# Patient Record
Sex: Male | Born: 1990 | Race: White | Hispanic: No | Marital: Single
Health system: Southern US, Community
[De-identification: ages and names within clinical notes are randomized; demographics above are authoritative.]

---

## 2019-10-14 ENCOUNTER — Emergency Department (HOSPITAL_COMMUNITY): Payer: Self-pay

## 2019-10-14 ENCOUNTER — Emergency Department (HOSPITAL_COMMUNITY)
Admission: EM | Admit: 2019-10-14 | Discharge: 2019-10-14 | Disposition: A | Discharge status: Home / Self Care | Payer: Self-pay | Attending: Emergency Medicine | Admitting: Emergency Medicine

## 2019-10-14 ENCOUNTER — Other Ambulatory Visit: Payer: Self-pay

## 2019-10-14 DIAGNOSIS — Y9389 Activity, other specified: Secondary | ICD-10-CM | POA: Insufficient documentation

## 2019-10-14 DIAGNOSIS — Y92410 Unspecified street and highway as the place of occurrence of the external cause: Secondary | ICD-10-CM | POA: Insufficient documentation

## 2019-10-14 DIAGNOSIS — T1490XA Injury, unspecified, initial encounter: Secondary | ICD-10-CM | POA: Insufficient documentation

## 2019-10-14 DIAGNOSIS — Y998 Other external cause status: Secondary | ICD-10-CM | POA: Insufficient documentation

## 2019-10-14 LAB — LACTIC ACID, PLASMA: Lactic Acid, Venous: 1.5 mmol/L (ref 0.5–1.9)

## 2019-10-14 LAB — SAMPLE TO BLOOD BANK

## 2019-10-14 LAB — PROTIME-INR
INR: 1 (ref 0.8–1.2)
Prothrombin Time: 12.8 seconds (ref 11.4–15.2)

## 2019-10-14 LAB — COMPREHENSIVE METABOLIC PANEL
ALT: 18 U/L (ref 0–44)
AST: 25 U/L (ref 15–41)
Albumin: 4 g/dL (ref 3.5–5.0)
Alkaline Phosphatase: 56 U/L (ref 38–126)
Anion gap: 12 (ref 5–15)
BUN: 15 mg/dL (ref 6–20)
CO2: 25 mmol/L (ref 22–32)
Calcium: 9 mg/dL (ref 8.9–10.3)
Chloride: 99 mmol/L (ref 98–111)
Creatinine, Ser: 0.99 mg/dL (ref 0.61–1.24)
GFR calc Af Amer: >60 mL/min (ref >60)
GFR calc non Af Amer: >60 mL/min (ref >60)
Glucose, Bld: 158 mg/dL — ABNORMAL HIGH (ref 70–99)
Potassium: 4 mmol/L (ref 3.5–5.1)
Sodium: 136 mmol/L (ref 135–145)
Total Bilirubin: 0.9 mg/dL (ref 0.3–1.2)
Total Protein: 7.5 g/dL (ref 6.5–8.1)

## 2019-10-14 LAB — CBC
HCT: 44.4 % (ref 39.0–52.0)
Hemoglobin: 14.3 g/dL (ref 13.0–17.0)
MCH: 28.5 pg (ref 26.0–34.0)
MCHC: 32.2 g/dL (ref 30.0–36.0)
MCV: 88.6 fL (ref 80.0–100.0)
Platelets: 220 10*3/uL (ref 150–400)
RBC: 5.01 MIL/uL (ref 4.22–5.81)
RDW: 12.9 % (ref 11.5–15.5)
WBC: 21.9 10*3/uL — ABNORMAL HIGH (ref 4.0–10.5)
nRBC: 0 % (ref 0.0–0.2)

## 2019-10-14 LAB — I-STAT CHEM 8, ED
BUN: 18 mg/dL (ref 6–20)
Calcium, Ion: 1.18 mmol/L (ref 1.15–1.40)
Chloride: 99 mmol/L (ref 98–111)
Creatinine, Ser: 0.8 mg/dL (ref 0.61–1.24)
Glucose, Bld: 156 mg/dL — ABNORMAL HIGH (ref 70–99)
HCT: 45 % (ref 39.0–52.0)
Hemoglobin: 15.3 g/dL (ref 13.0–17.0)
Potassium: 4.1 mmol/L (ref 3.5–5.1)
Sodium: 140 mmol/L (ref 135–145)
TCO2: 29 mmol/L (ref 22–32)

## 2019-10-14 LAB — ETHANOL: Alcohol, Ethyl (B): <10 mg/dL (ref <10)

## 2019-10-14 MED ORDER — LACTATED RINGERS IV BOLUS
1000.0000 mL | Freq: Once | INTRAVENOUS | Status: AC
  Administered 2019-10-14: 1000 mL via INTRAVENOUS

## 2019-10-14 MED ORDER — IOHEXOL 300 MG/ML  SOLN
100.0000 mL | Freq: Once | INTRAMUSCULAR | Status: AC | PRN
  Administered 2019-10-14: 100 mL via INTRAVENOUS

## 2019-10-14 MED ORDER — FENTANYL CITRATE (PF) 100 MCG/2ML IJ SOLN
100.0000 ug | Freq: Once | INTRAMUSCULAR | Status: AC
  Administered 2019-10-14: 100 ug via INTRAVENOUS
  Filled 2019-10-14: qty 2
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
## 2019-10-14 NOTE — ED Notes (Signed)
Family at bedside. 

## 2019-10-14 NOTE — ED Notes (Signed)
Pt and family verbalize understanding of d/c instructions, follow up and pain control.  Sister at bedside assisting with care. Pt to WR via WC. NAD

## 2019-10-14 NOTE — ED Notes (Signed)
Pt ambulated with no assistance.
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
